# Patient Record
Sex: Male | Born: 1980 | Race: White | Hispanic: No
Health system: Southern US, Community
[De-identification: ages and names within clinical notes are randomized; demographics above are authoritative.]

---

## 2004-11-22 ENCOUNTER — Emergency Department: Payer: Self-pay | Admitting: Emergency Medicine

## 2007-06-16 ENCOUNTER — Emergency Department: Payer: Self-pay | Admitting: Emergency Medicine

## 2011-02-07 ENCOUNTER — Ambulatory Visit: Payer: Self-pay | Admitting: Family Medicine

## 2011-10-12 ENCOUNTER — Emergency Department: Payer: Self-pay | Admitting: Emergency Medicine

## 2011-10-15 ENCOUNTER — Ambulatory Visit: Payer: Self-pay | Admitting: Internal Medicine

## 2012-01-18 ENCOUNTER — Emergency Department: Payer: Self-pay | Admitting: Emergency Medicine

## 2012-05-10 ENCOUNTER — Emergency Department: Payer: Self-pay | Admitting: Emergency Medicine

## 2012-05-10 LAB — DRUG SCREEN, URINE
Amphetamines, Ur Screen: POSITIVE (ref ?–1000)
Cannabinoid 50 Ng, Ur ~~LOC~~: NEGATIVE (ref ?–50)
Cocaine Metabolite,Ur ~~LOC~~: NEGATIVE (ref ?–300)
MDMA (Ecstasy)Ur Screen: NEGATIVE (ref ?–500)
Opiate, Ur Screen: NEGATIVE (ref ?–300)
Phencyclidine (PCP) Ur S: NEGATIVE (ref ?–25)
Tricyclic, Ur Screen: NEGATIVE (ref ?–1000)

## 2012-05-10 LAB — URINALYSIS, COMPLETE
Bilirubin,UR: NEGATIVE
Glucose,UR: NEGATIVE mg/dL (ref 0–75)
Ketone: NEGATIVE
Leukocyte Esterase: NEGATIVE
Nitrite: NEGATIVE
Protein: NEGATIVE
Specific Gravity: 1.029 (ref 1.003–1.030)
Squamous Epithelial: NONE SEEN

## 2012-05-10 LAB — CBC
HCT: 39.3 % — ABNORMAL LOW (ref 40.0–52.0)
HGB: 13.3 g/dL (ref 13.0–18.0)
MCH: 25 pg — ABNORMAL LOW (ref 26.0–34.0)
MCV: 74 fL — ABNORMAL LOW (ref 80–100)
Platelet: 304 10*3/uL (ref 150–440)
RBC: 5.3 10*6/uL (ref 4.40–5.90)
WBC: 10.4 10*3/uL (ref 3.8–10.6)

## 2012-05-10 LAB — COMPREHENSIVE METABOLIC PANEL
Albumin: 4.2 g/dL (ref 3.4–5.0)
Alkaline Phosphatase: 87 U/L (ref 50–136)
Bilirubin,Total: 0.4 mg/dL (ref 0.2–1.0)
Co2: 27 mmol/L (ref 21–32)
Creatinine: 0.9 mg/dL (ref 0.60–1.30)
EGFR (African American): >60
SGOT(AST): 28 U/L (ref 15–37)
SGPT (ALT): 28 U/L (ref 12–78)

## 2012-05-10 LAB — ETHANOL: Ethanol: <3 mg/dL

## 2012-05-10 LAB — TSH: Thyroid Stimulating Horm: 2.69 u[IU]/mL

## 2012-05-10 LAB — ACETAMINOPHEN LEVEL: Acetaminophen: <2 ug/mL

## 2012-06-26 ENCOUNTER — Ambulatory Visit: Payer: Self-pay | Admitting: Internal Medicine

## 2012-07-07 ENCOUNTER — Emergency Department: Payer: Self-pay | Admitting: Emergency Medicine

## 2012-07-07 LAB — COMPREHENSIVE METABOLIC PANEL
Albumin: 3.8 g/dL (ref 3.4–5.0)
Anion Gap: 7 (ref 7–16)
BUN: 11 mg/dL (ref 7–18)
Calcium, Total: 8.7 mg/dL (ref 8.5–10.1)
Chloride: 107 mmol/L (ref 98–107)
Co2: 26 mmol/L (ref 21–32)
EGFR (Non-African Amer.): >60
Glucose: 89 mg/dL (ref 65–99)
Osmolality: 278 (ref 275–301)
Potassium: 3.7 mmol/L (ref 3.5–5.1)
Sodium: 140 mmol/L (ref 136–145)
Total Protein: 6.9 g/dL (ref 6.4–8.2)

## 2012-07-07 LAB — DRUG SCREEN, URINE
Barbiturates, Ur Screen: NEGATIVE (ref ?–200)
Benzodiazepine, Ur Scrn: POSITIVE (ref ?–200)
Cannabinoid 50 Ng, Ur ~~LOC~~: NEGATIVE (ref ?–50)
MDMA (Ecstasy)Ur Screen: NEGATIVE (ref ?–500)
Opiate, Ur Screen: POSITIVE (ref ?–300)
Tricyclic, Ur Screen: NEGATIVE (ref ?–1000)

## 2012-07-07 LAB — TSH: Thyroid Stimulating Horm: 0.92 u[IU]/mL

## 2012-07-07 LAB — CBC
HCT: 36.6 % — ABNORMAL LOW (ref 40.0–52.0)
HGB: 12.5 g/dL — ABNORMAL LOW (ref 13.0–18.0)
MCH: 25.5 pg — ABNORMAL LOW (ref 26.0–34.0)
MCHC: 34.3 g/dL (ref 32.0–36.0)
RBC: 4.92 10*6/uL (ref 4.40–5.90)

## 2012-07-07 LAB — ACETAMINOPHEN LEVEL: Acetaminophen: <2 ug/mL

## 2012-07-07 LAB — ETHANOL
Ethanol %: <0.003 % (ref 0.000–0.080)
Ethanol: <3 mg/dL

## 2012-07-07 LAB — SALICYLATE LEVEL: Salicylates, Serum: 2.9 mg/dL — ABNORMAL HIGH

## 2013-04-09 IMAGING — CR DG KNEE COMPLETE 4+V*R*
1 series · 4 of 4 positions shown · non-contrast
Comparison: none

REASON FOR EXAM: fall, posterior medial knee pain
COMMENTS:

PROCEDURE:     DXR - DXR KNEE RT COMP WITH OBLIQUES  - October 12, 2011  [DATE]
RESULT:     Images of the right knee demonstrate no fracture, dislocation or
foreign body.

[Series 1: t knee ap right · 0.14mm/px · 4 of 4 slices shown]
[im 1/4]
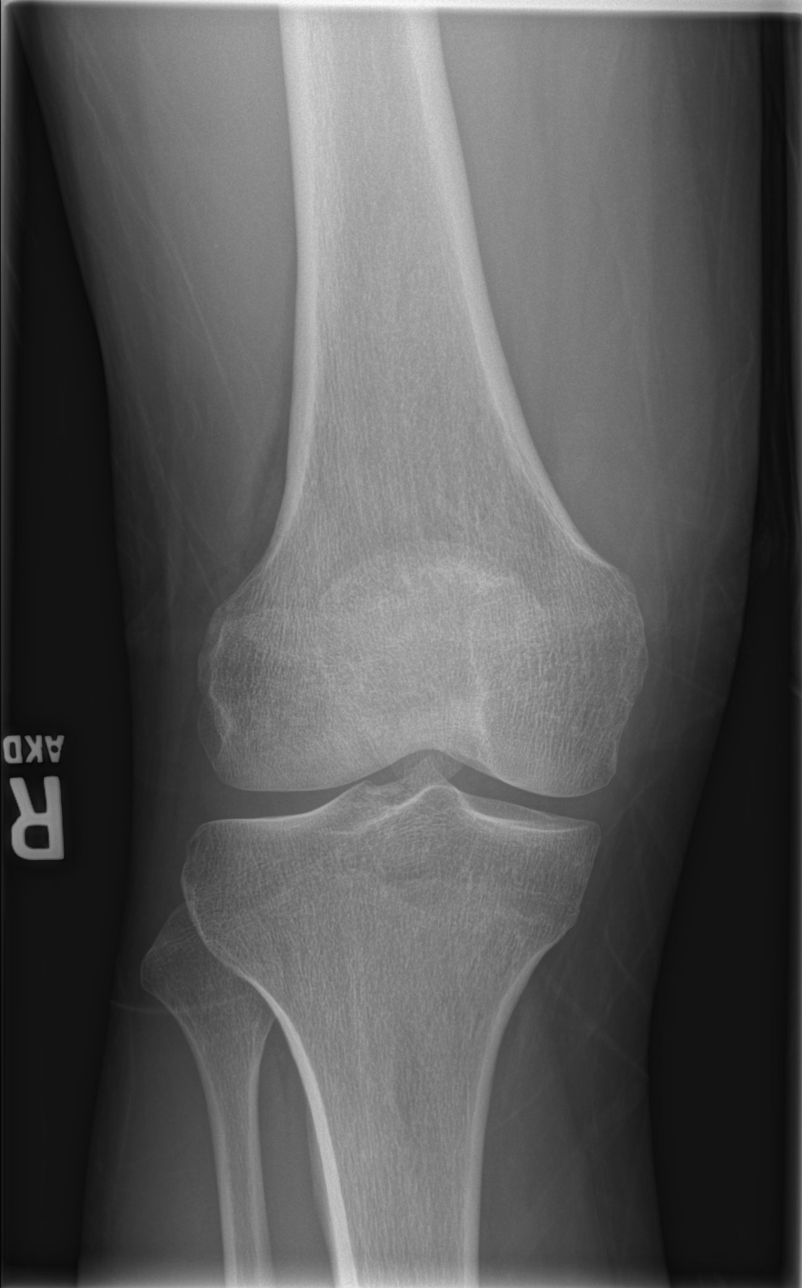
[im 2/4]
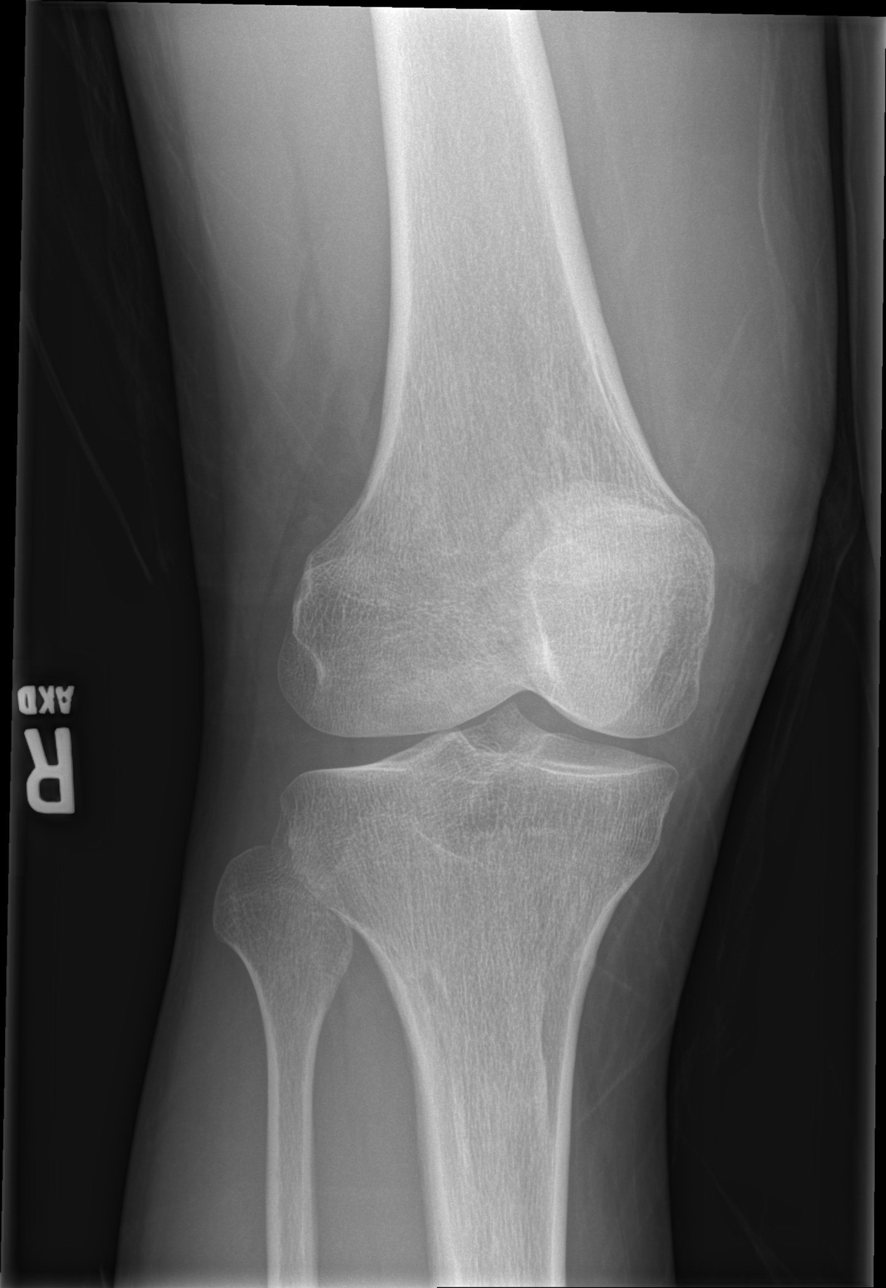
[im 3/4]
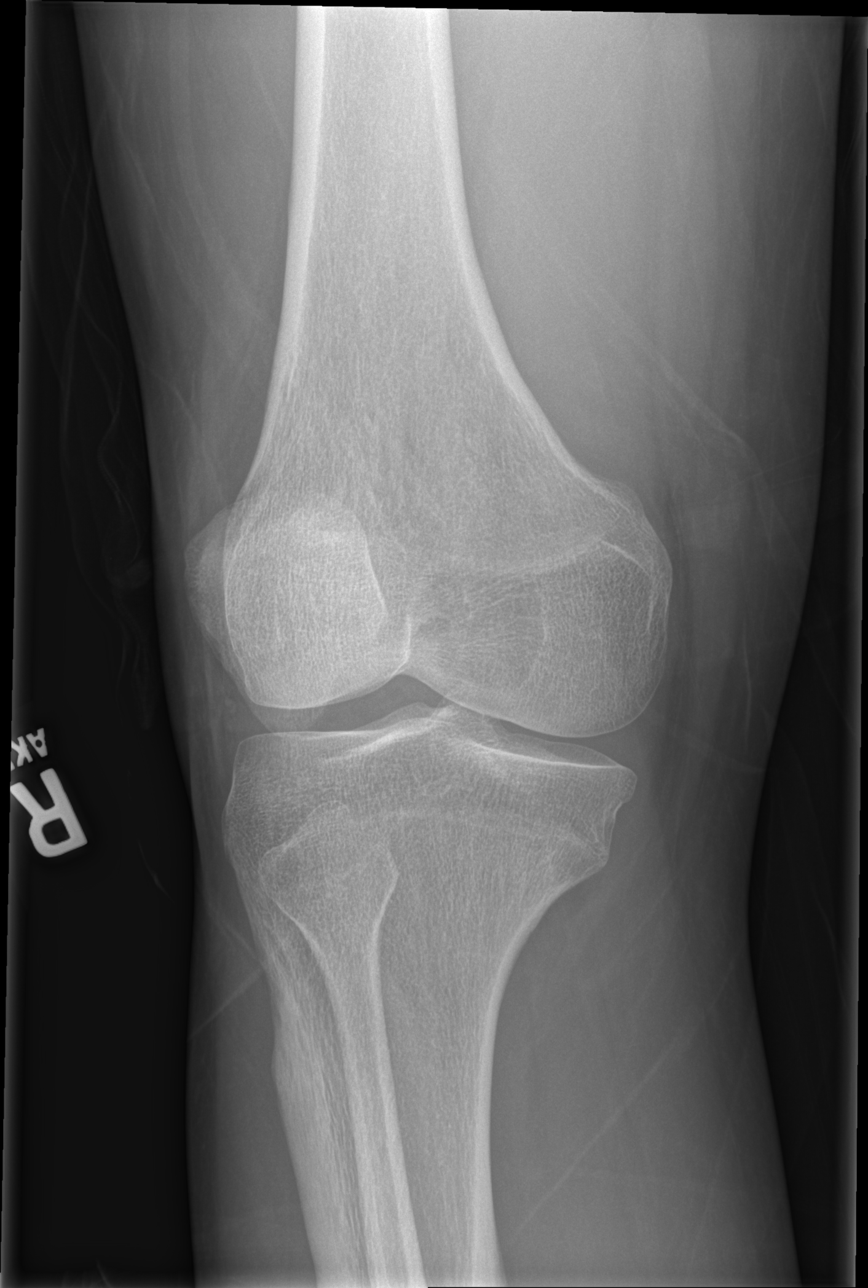
[im 4/4]
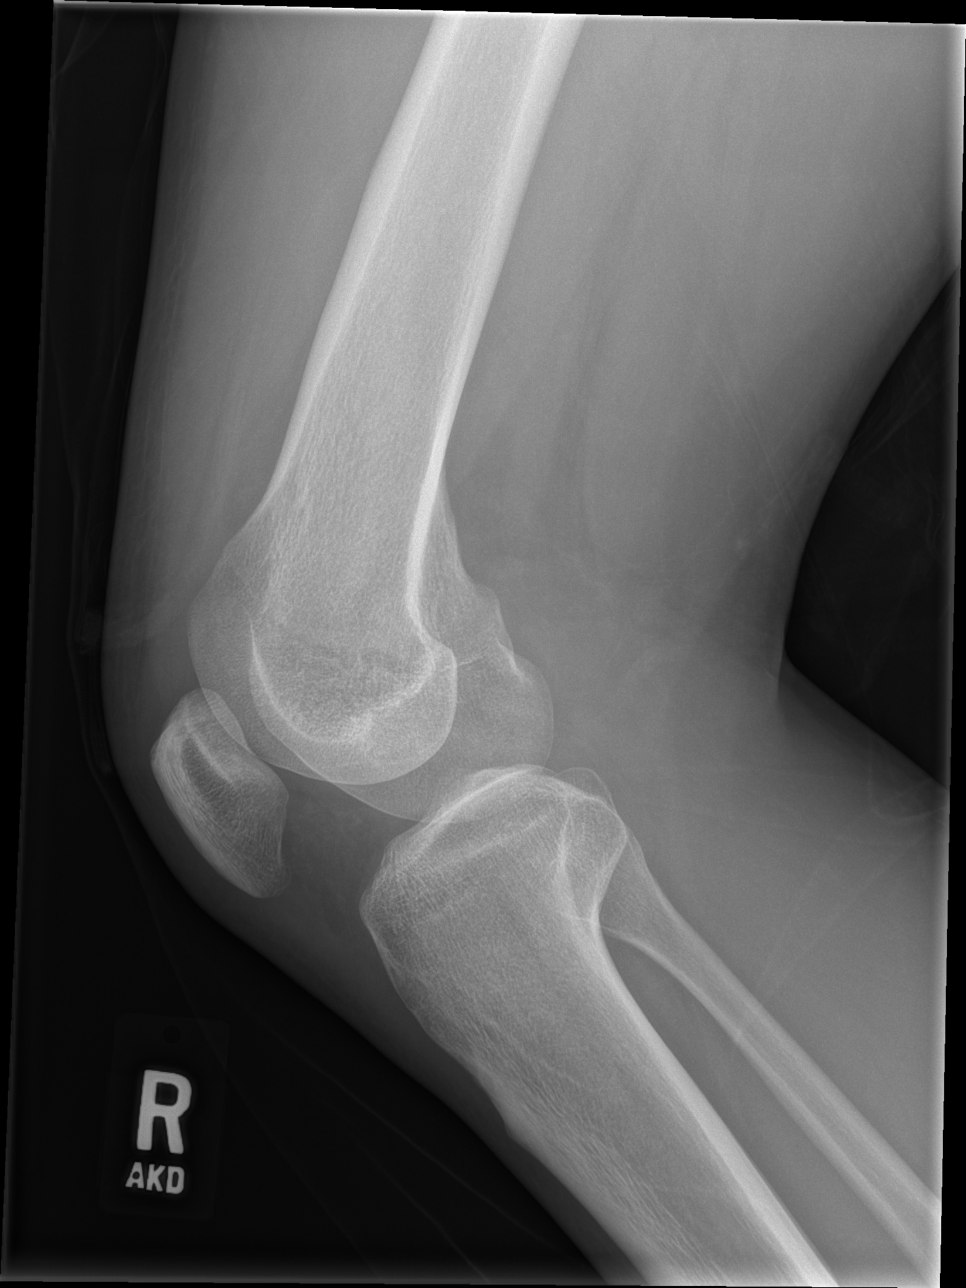

[4 of 4 positions shown; findings below may reference images not displayed]

IMPRESSION: Please see above.

## 2013-09-27 ENCOUNTER — Emergency Department: Payer: Self-pay | Admitting: Emergency Medicine

## 2013-09-27 LAB — COMPREHENSIVE METABOLIC PANEL
ALBUMIN: 3.7 g/dL (ref 3.4–5.0)
ALT: 25 U/L (ref 12–78)
AST: 29 U/L (ref 15–37)
Alkaline Phosphatase: 80 U/L
Anion Gap: 3 — ABNORMAL LOW (ref 7–16)
BUN: 8 mg/dL (ref 7–18)
Bilirubin,Total: 0.3 mg/dL (ref 0.2–1.0)
CALCIUM: 8.9 mg/dL (ref 8.5–10.1)
CHLORIDE: 106 mmol/L (ref 98–107)
Co2: 27 mmol/L (ref 21–32)
Creatinine: 0.82 mg/dL (ref 0.60–1.30)
EGFR (Non-African Amer.): >60
GLUCOSE: 125 mg/dL — AB (ref 65–99)
Osmolality: 272 (ref 275–301)
Potassium: 3.6 mmol/L (ref 3.5–5.1)
Sodium: 136 mmol/L (ref 136–145)
Total Protein: 7 g/dL (ref 6.4–8.2)

## 2013-09-27 LAB — CBC
HCT: 40.4 % (ref 40.0–52.0)
HGB: 13.3 g/dL (ref 13.0–18.0)
MCH: 25.5 pg — AB (ref 26.0–34.0)
MCHC: 33 g/dL (ref 32.0–36.0)
MCV: 77 fL — AB (ref 80–100)
Platelet: 264 10*3/uL (ref 150–440)
RBC: 5.23 10*6/uL (ref 4.40–5.90)
RDW: 14.6 % — ABNORMAL HIGH (ref 11.5–14.5)
WBC: 9.9 10*3/uL (ref 3.8–10.6)

## 2013-09-27 LAB — TROPONIN I: Troponin-I: <0.02 ng/mL

## 2014-12-13 NOTE — Consult Note (Signed)
PATIENT NAME:  Darin Rodriguez, Darin Rodriguez MR#:  [id number] DATE OF BIRTH:  1981/04/29  DATE OF CONSULTATION:  07/07/2012  REFERRING PHYSICIAN:  Lowella Fairy, MD   CONSULTING PHYSICIAN:  Jolanta B. Pucilowska, MD  REASON FOR CONSULTATION: To evaluate a patient after a suicide attempt.   IDENTIFYING DATA: Mr. Ripberger is a 34 year old male with history of substance abuse.   CHIEF COMPLAINT: "I don't care."   HISTORY OF PRESENT ILLNESS Mr. Pannone was brought to the Emergency Room by the police after he lacerated both of his wrists and skin under his chin. The ones on his arms required stitches. We have learned that the patient went to the house of his estranged wife where his children reside and wanted to see his kids. He was not allowed to do so. He started banging on the door and then cut himself. The police were called to the scene. His urine tox screen was positive for amphetamines, benzodiazepines and opioids. The patient has been in the care of a psychiatrist, Dr. Lenore Cordia, in [place] who prescribes  Adderall, Klonopin and Paxil. The patient reports that he ran out of Suboxone a month ago as he no longer could afford to go to [facility]. He reported to the nurse that he overuses Klonopin, taking 10 mg on some days. He denies feeling suicidal but believe that he went "crazy" when unable to see his children. He is reportedly being charged with attempt to kidnap his wife. He denies and laughs at the idea. He denies paranoid delusions or hallucinations. He denies alcohol use.   PAST PSYCHIATRIC HISTORY: He denies any prior hospitalizations or substance abuse treatment. No prior suicide attempts.   FAMILY PSYCHIATRIC HISTORY: None reported.   PAST MEDICAL HISTORY: None. Recent cut on forearms sutured.   ALLERGIES: No known drug allergies.   MEDICATIONS ON ADMISSION: Per his old chart: 1. Flexeril 10 mg t.i.d.  2. Suboxone 8 mg t.i.d.  3. Adderall 20 mg b.i.d.   4. Klonopin 1 mg t.i.d. as needed for  anxiety.  5. Paxil 10 mg daily.   SOCIAL HISTORY: He is still employed, has been working at the same place for the past three years. The job is well-paying, and he was able to support his family. He works second shift. His wife moved out in September and would not allow him to see the kids. He has a conflict with almost everybody in his family as well as his wife.    REVIEW OF SYSTEMS: CONSTITUTIONAL: No fevers or chills. Positive for some weight loss. EYES: No double or blurred vision. ENT: No hearing loss. RESPIRATORY: No shortness of breath or cough. CARDIOVASCULAR: No chest pain or orthopnea. GASTROINTESTINAL: No abdominal pain, nausea, vomiting, or diarrhea. GU: No incontinence or frequency. ENDOCRINE: No heat or cold intolerance. LYMPHATIC: No anemia or easy bruising. INTEGUMENTARY: No acne or rash. MUSCULOSKELETAL: No muscle or joint pain. NEUROLOGIC: No tingling or weakness. PSYCHIATRIC: See history of present illness for details.   PHYSICAL EXAMINATION:  VITAL SIGNS: Blood pressure 121/59, pulse 83, respirations 16, temperature 98.2.   GENERAL: This is a slender male in no acute distress. The rest of the physical examination is deferred to his primary attending.   LABORATORY DATA: Chemistries are within normal limits. Blood alcohol level is zero. LFTs are within normal limits except for AST of 54 and ALT of 90. TSH 0.92. Urine tox screen positive for amphetamines, benzodiazepines and opiates. CBC within normal limits. Serum acetaminophen less than 2.0. Serum salicylates 2.9.  MENTAL STATUS EXAMINATION: The patient is asleep but easily arousable. He is oriented to person, place, time, and situation. He is cooperative. He is very quiet. There is psychomotor retardation. He is tearful on the interview. He wears a yellow shirt. He maintains some eye contact. His speech is very soft. Mood is anxious with tearful affect. Thought processing is logical and goal oriented. There is poverty of thought.  Thought content: He denies suicidal or homicidal ideation but was brought to the hospital after a suicide attempt. There are no delusions or paranoia. There are no auditory or visual hallucinations. His cognition is grossly intact. His insight and judgment are questionable.   SUICIDE RISK ASSESSMENT: This is a patient with a history of depression, anxiety and substance abuse who just attempted a suicide.  DIAGNOSES:  AXIS I:  1. Major depressive disorder, recurrent, severe. 2. Opioid dependence.   AXIS II: Deferred.   AXIS III: Chronic pain.   AXIS IV: Mental illness, substance abuse, marital conflict, family conflict, primary support, access to care.   AXIS V: Global Assessment of Functioning score is 25.   PLAN:  1. The patient is under arrest. He requires psychiatric care at the same time. We believe that our best scenario is to refer this patient to [facility] facility as we believe that substance use led to the unfortunate events today and his arrest.  2. We will continue all his medications as prescribed by Dr. Lenore Cordiaorrigan except for benzodiazepines and Suboxone. The patient has been off of it for a while now.  3. I will follow up.  ____________________________ Braulio ConteJolanta B. Jennet MaduroPucilowska, MD jbp:cbb D: 07/07/2012 16:36:00 ET T: 07/07/2012 17:36:36 ET JOB#: [id number]  cc: Jolanta B. Jennet MaduroPucilowska, MD, <Dictator> Shari ProwsJOLANTA B PUCILOWSKA MD ELECTRONICALLY SIGNED 07/07/2012 19:56
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
## 2014-12-13 NOTE — Consult Note (Signed)
Brief Consult Note: Diagnosis: MDD, ADHD, Opioid dependence.   Patient was seen by consultant.   Consult note dictated.   Recommend further assessment or treatment.   Orders entered.   Discussed with Attending MD.   Comments: [name] has a h/o depression, anxiety, ADHD and opioid dependence on Suboxone. He was petitioned by his parents after he sent a text message suggesting he was suicidal. He denies suicide ideation, intention or a plan and admits that he was trying to get his wife's sympathy. She left him 2 weeks ago when she discovered that he was having an affair. He denies thoughts of hurting himself or others.   PLAN: 1. The patient no longer meets criteria for IVC. I will terminate proceedings. Please discharge as appropriate.   2. He is to continue all medications as prescribed by Dr. Ocie Rodriguez. He run out of paxil. I will provide Rx.  3. He will follow up with Dr. Ocie Rodriguez on Friday.  4. His family will pick him up.  Electronic Signatures: Darin Rodriguez, Darin Rodriguez (MD)  (Signed 16-Sep-13 11:47)  Authored: Brief Consult Note   Last Updated: 16-Sep-13 11:47 by Darin Rodriguez, Darin Rodriguez (MD)

## 2017-07-01 ENCOUNTER — Emergency Department: Payer: Self-pay

## 2017-07-01 ENCOUNTER — Encounter: Payer: Self-pay | Admitting: Emergency Medicine

## 2017-07-01 ENCOUNTER — Emergency Department
Admission: EM | Admit: 2017-07-01 | Discharge: 2017-07-01 | Disposition: A | Discharge status: Home / Self Care | Payer: Self-pay | Attending: Student in an Organized Health Care Education/Training Program | Admitting: Student in an Organized Health Care Education/Training Program

## 2017-07-01 DIAGNOSIS — F17200 Nicotine dependence, unspecified, uncomplicated: Secondary | ICD-10-CM | POA: Insufficient documentation

## 2017-07-01 DIAGNOSIS — R05 Cough: Secondary | ICD-10-CM | POA: Insufficient documentation

## 2017-07-01 DIAGNOSIS — R683 Clubbing of fingers: Secondary | ICD-10-CM | POA: Insufficient documentation

## 2017-07-01 DIAGNOSIS — R0602 Shortness of breath: Secondary | ICD-10-CM | POA: Insufficient documentation

## 2017-07-01 DIAGNOSIS — R059 Cough, unspecified: Secondary | ICD-10-CM

## 2017-07-01 LAB — TROPONIN I

## 2017-07-01 LAB — CBC WITH DIFFERENTIAL/PLATELET
BASOS ABS: 0.1 10*3/uL (ref 0–0.1)
BASOS PCT: 1 %
EOS PCT: 2 %
Eosinophils Absolute: 0.3 10*3/uL (ref 0–0.7)
HEMATOCRIT: 45.1 % (ref 40.0–52.0)
Hemoglobin: 15 g/dL (ref 13.0–18.0)
Lymphocytes Relative: 27 %
Lymphs Abs: 2.8 10*3/uL (ref 1.0–3.6)
MCH: 25.9 pg — ABNORMAL LOW (ref 26.0–34.0)
MCHC: 33.3 g/dL (ref 32.0–36.0)
MCV: 78 fL — ABNORMAL LOW (ref 80.0–100.0)
MONO ABS: 1.1 10*3/uL — AB (ref 0.2–1.0)
Monocytes Relative: 10 %
NEUTROS ABS: 6.2 10*3/uL (ref 1.4–6.5)
Neutrophils Relative %: 60 %
PLATELETS: 327 10*3/uL (ref 150–440)
RBC: 5.78 MIL/uL (ref 4.40–5.90)
RDW: 14.2 % (ref 11.5–14.5)
WBC: 10.5 10*3/uL (ref 3.8–10.6)

## 2017-07-01 LAB — BASIC METABOLIC PANEL
ANION GAP: 11 (ref 5–15)
BUN: 10 mg/dL (ref 6–20)
CALCIUM: 9.7 mg/dL (ref 8.9–10.3)
CO2: 26 mmol/L (ref 22–32)
Chloride: 102 mmol/L (ref 101–111)
Creatinine, Ser: 0.8 mg/dL (ref 0.61–1.24)
Glucose, Bld: 90 mg/dL (ref 65–99)
Potassium: 3.6 mmol/L (ref 3.5–5.1)
Sodium: 139 mmol/L (ref 135–145)

## 2017-07-01 LAB — BRAIN NATRIURETIC PEPTIDE: B NATRIURETIC PEPTIDE 5: 4 pg/mL (ref 0.0–100.0)

## 2017-07-01 MED ORDER — ALBUTEROL SULFATE HFA 108 (90 BASE) MCG/ACT IN AERS: 2.0000 | INHALATION_SPRAY | Freq: Four times a day (QID) | RESPIRATORY_TRACT | 2 refills | Status: AC | PRN

## 2017-07-01 MED ORDER — ALBUTEROL SULFATE (2.5 MG/3ML) 0.083% IN NEBU: 5.0000 mg | INHALATION_SOLUTION | Freq: Once | RESPIRATORY_TRACT | Status: DC

## 2017-07-01 MED ORDER — PREDNISONE 10 MG PO TABS: 10.0000 mg | ORAL_TABLET | Freq: Every day | ORAL | 0 refills | Status: AC

## 2017-07-01 NOTE — ED Provider Notes (Signed)
[facility] Emergency Department Provider Note    First MD Initiated Contact with Patient 07/01/17 1319     (approximate)  I have reviewed the triage vital signs and the nursing notes.   HISTORY  Chief Complaint Shortness of Breath and Cough    HPI Darin Rodriguez is a 36 y.o. male presents with chief complaint of shortness of breath and intermittent cough that is been going on for several months.  Patient does smoke half pack per day and has been doing this for the past 10 years.  Denies any chest pain.  No lower extremity swelling.  Has noted clubbing of his digits that he is noted since he was a kid.  No family history of known lung disease.  Does have a remote history of family history of cancer.  No known h/o heart disease  History reviewed. No pertinent past medical history. No family history on file. History reviewed. No pertinent surgical history. There are no active problems to display for this patient.     Prior to Admission medications   Medication Sig Start Date End Date Taking? Authorizing Provider  albuterol (PROVENTIL HFA;VENTOLIN HFA) 108 (90 Base) MCG/ACT inhaler Inhale 2 puffs every 6 (six) hours as needed into the lungs for wheezing or shortness of breath. 07/01/17   Willy Eddyobinson, Renelda Kilian, MD  predniSONE (DELTASONE) 10 MG tablet Take 1 tablet (10 mg total) daily by mouth. Day 1-2: Take 50 mg  ( 5 pills) Day 3-4 : Take 40 mg (4pills) Day 5-6: Take 30 mg (3 pills) Day 7-8:  Take 20 mg (2 pills) Day 9:  Take 10mg  (1 pill) 07/01/17   Willy Eddyobinson, Keagen Heinlen, MD    Allergies Patient has no known allergies.    Social History Social History   Tobacco Use  . Smoking status: Not on file  Substance Use Topics  . Alcohol use: Not on file  . Drug use: Not on file    Review of Systems Patient denies headaches, rhinorrhea, blurry vision, numbness, shortness of breath, chest pain, edema, cough, abdominal pain, nausea, vomiting, diarrhea, dysuria,  fevers, rashes or hallucinations unless otherwise stated above in HPI. ____________________________________________   PHYSICAL EXAM:  VITAL SIGNS: Vitals:   07/01/17 0905  BP: 123/65  Pulse: 98  Resp: 16  Temp: 98.4 F (36.9 C)  SpO2: 98%    Constitutional: Alert and oriented. Well appearing and in no acute distress. Eyes: Conjunctivae are normal.  Head: Atraumatic. Nose: No congestion/rhinnorhea. Mouth/Throat: Mucous membranes are moist.   Neck: No stridor. Painless ROM.  Cardiovascular: Normal rate, regular rhythm. Grossly normal heart sounds.  Good peripheral circulation. Respiratory: Normal respiratory effort.  No retractions. Lungs with scattered wheeze Gastrointestinal: Soft and nontender. No distention. No abdominal bruits. No CVA tenderness. Genitourinary: Musculoskeletal: No lower extremity tenderness nor edema.  No joint effusions.  + clubbed fingers and toes, no cyanosis Neurologic:  Normal speech and language. No gross focal neurologic deficits are appreciated. No facial droop Skin:  Skin is warm, dry and intact. No rash noted. Psychiatric: Mood and affect are normal. Speech and behavior are normal.  ____________________________________________   LABS (all labs ordered are listed, but only abnormal results are displayed)  Results for orders placed or performed during the hospital encounter of 07/01/17 (from the past 24 hour(s))  CBC with Differential     Status: Abnormal   Collection Time: 07/01/17  9:57 AM  Result Value Ref Range   WBC 10.5 3.8 - 10.6 K/uL  RBC 5.78 4.40 - 5.90 MIL/uL   Hemoglobin 15.0 13.0 - 18.0 g/dL   HCT 16.145.1 09.640.0 - 04.552.0 %   MCV 78.0 (L) 80.0 - 100.0 fL   MCH 25.9 (L) 26.0 - 34.0 pg   MCHC 33.3 32.0 - 36.0 g/dL   RDW 40.914.2 81.111.5 - 91.414.5 %   Platelets 327 150 - 440 K/uL   Neutrophils Relative % 60 %   Neutro Abs 6.2 1.4 - 6.5 K/uL   Lymphocytes Relative 27 %   Lymphs Abs 2.8 1.0 - 3.6 K/uL   Monocytes Relative 10 %   Monocytes  Absolute 1.1 (H) 0.2 - 1.0 K/uL   Eosinophils Relative 2 %   Eosinophils Absolute 0.3 0 - 0.7 K/uL   Basophils Relative 1 %   Basophils Absolute 0.1 0 - 0.1 K/uL  Basic metabolic panel     Status: None   Collection Time: 07/01/17  9:57 AM  Result Value Ref Range   Sodium 139 135 - 145 mmol/L   Potassium 3.6 3.5 - 5.1 mmol/L   Chloride 102 101 - 111 mmol/L   CO2 26 22 - 32 mmol/L   Glucose, Bld 90 65 - 99 mg/dL   BUN 10 6 - 20 mg/dL   Creatinine, Ser 7.820.80 0.61 - 1.24 mg/dL   Calcium 9.7 8.9 - 95.610.3 mg/dL   GFR calc non Af Amer >60 >60 mL/min   GFR calc Af Amer >60 >60 mL/min   Anion gap 11 5 - 15  Brain natriuretic peptide     Status: None   Collection Time: 07/01/17  9:57 AM  Result Value Ref Range   B Natriuretic Peptide 4.0 0.0 - 100.0 pg/mL  Troponin I     Status: None   Collection Time: 07/01/17  9:57 AM  Result Value Ref Range   Troponin I <0.03 <0.03 ng/mL   ____________________________________________  EKG My review and personal interpretation at Time: 9:30   Indication: sob  Rate: 85  Rhythm: sinus Axis: normal Other: no stemi, poor rwave progression ____________________________________________  RADIOLOGY  I personally reviewed all radiographic images ordered to evaluate for the above acute complaints and reviewed radiology reports and findings.  These findings were personally discussed with the patient.  Please see medical record for radiology report.  ____________________________________________   PROCEDURES  Procedure(s) performed:  Procedures    Critical Care performed: no ____________________________________________   INITIAL IMPRESSION / ASSESSMENT AND PLAN / ED COURSE  Pertinent labs & imaging results that were available during my care of the patient were reviewed by me and considered in my medical decision making (see chart for details).  DDX: Asthma, copd, CHF, pna, ptx, malignancy, Pe, anemia   Darin Rodriguez is a 36 y.o. who presents to  the ED with presents with several months of cough and shortness of breath.  Patient in no respiratory distress and symptoms have been ongoing for quite some time now.  Has not sought medical attention previously.  Patient is a smoker and his exam is concerning for some component of obstructive lung disease.  EKG shows no evidence of acute ischemia troponin is negative.  Patient's without chest pain.  No evidence of congestive heart failure.  Patient is low risk by Wells criteria and is PERC negative.  No evidence of pneumonia.  Based on his smoking history discussed with his smoking cessation as well as initiation of albuterol inhaler therapy with a prednisone burst with follow-up with PCP and referral to pulmonology.  Discussed  signs and symptoms for which he should return immediately to the hospital.      ____________________________________________   FINAL CLINICAL IMPRESSION(S) / ED DIAGNOSES  Final diagnoses:  Shortness of breath  Cough  Clubbing of fingers      NEW MEDICATIONS STARTED DURING THIS VISIT:  This SmartLink is deprecated. Use AVSMEDLIST instead to display the medication list for a patient.   Note:  This document was prepared using Dragon voice recognition software and may include unintentional dictation errors.    Willy Eddy, MD 07/01/17 [phone]

## 2017-07-01 NOTE — ED Triage Notes (Signed)
Pt reports for the past several months he has had some episodes where he could not breath. Pt reports he feels as though he can not get a good breath in. Pt reports that he will often time wake up at night choking because he can not breathe. Pt reports that he has clubbed fingers and toes and was told at an early age that it may cause issues with his lungs as he gets older. Pt reports symptoms have been extremely bad over the past 2 week.

## 2017-07-01 NOTE — ED Notes (Signed)
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

## 2022-04-22 ENCOUNTER — Other Ambulatory Visit: Payer: Self-pay

## 2022-04-22 ENCOUNTER — Emergency Department
Admission: EM | Admit: 2022-04-22 | Discharge: 2022-04-22 | Disposition: A | Discharge status: Home / Self Care | Payer: Self-pay | Attending: Physician Assistant | Admitting: Physician Assistant

## 2022-04-22 ENCOUNTER — Encounter: Payer: Self-pay | Admitting: Emergency Medicine

## 2022-04-22 DIAGNOSIS — K029 Dental caries, unspecified: Secondary | ICD-10-CM

## 2022-04-22 DIAGNOSIS — K047 Periapical abscess without sinus: Secondary | ICD-10-CM

## 2022-04-22 MED ORDER — AMOXICILLIN-POT CLAVULANATE 875-125 MG PO TABS
1.0000 | ORAL_TABLET | Freq: Once | ORAL | Status: AC
  Administered 2022-04-22: 1 via ORAL
  Filled 2022-04-22: qty 1

## 2022-04-22 MED ORDER — AMOXICILLIN-POT CLAVULANATE 875-125 MG PO TABS: 1.0000 | ORAL_TABLET | Freq: Two times a day (BID) | ORAL | 0 refills | Status: AC

## 2022-04-22 NOTE — Discharge Instructions (Addendum)
Take antibiotic as directed until all pills are gone.  Follow-up with one of the dental clinics listed below.  OPTIONS FOR DENTAL FOLLOW UP CARE  [place] [facility] - Local Safety Net Dental Clinics [email or web address]   [facility] [phone])  Darin Rodriguez [phone])  [place] [phone])  [facility] [phone])  [facility] [phone]) This clinic caters to the indigent population and is on a lottery system. Location: [employer] [facility], [employer], [address] [place] Clinic Hours: Wednesdays from 6pm - 9pm, patients seen by a lottery system. For dates, call or go to [email or web address] Services: Cleanings, fillings and simple extractions. Payment Options: DENTAL WORK IS FREE OF CHARGE. Bring proof of income or support. Best way to get seen: Arrive at 5:15 pm - this is a lottery, NOT first come/first serve, so arriving earlier will not increase your chances of being seen.     [facility] [phone] Select option 1 for emergencies   Location: [facility], [place], [address], [place] Clinic Hours: No walk-ins accepted - call the day before to schedule an appointment. Check in times are 9:30 am and 1:30 pm. Services: Simple extractions, temporary fillings, pulpectomy/pulp debridement, uncomplicated abscess drainage. Payment Options: PAYMENT IS DUE AT THE TIME OF SERVICE.  Fee is usually $100-200, additional surgical procedures (e.g. abscess drainage) may be extra. Cash, checks, Visa/MasterCard accepted.  Can file Medicaid if patient is covered for dental - patient should call case worker to check. No discount for [facility] patients. Best way to get seen: MUST call the day before and get onto the schedule. Can  usually be seen the next 1-2 days. No walk-ins accepted.     [facility] [phone]   Location: [facility], [address] [place] Clinic Hours: M, W, Th, F 8am or 1:30pm, Tues 9a or 1:30 - first come/first served. Services: Simple extractions, temporary fillings, uncomplicated abscess drainage.  You do not need to be an [facility] resident. Payment Options: PAYMENT IS DUE AT THE TIME OF SERVICE. Dental insurance, otherwise sliding scale - bring proof of income or support. Depending on income and treatment needed, cost is usually $50-200. Best way to get seen: Arrive early as it is first come/first served.     [facility] [phone]   Location: [address] Clinic Hours: Mon-Thu 8a-5p Services: Most basic dental services including extractions and fillings. Payment Options: PAYMENT IS DUE AT THE TIME OF SERVICE. Sliding scale, up to 50% off - bring proof if income or support. Medicaid with dental option accepted. Best way to get seen: Call to schedule an appointment, can usually be seen within 2 weeks OR they will try to see walk-ins - show up at 8a or 2p (you may have to wait).     [facility] [phone] [place] RESIDENTS ONLY   Location: [facility], [address] [place], [place] [address] Clinic Hours: By appointment only. Monday - Thursday 8am-5pm, Friday 8am-12pm Services: Cleanings, fillings, extractions. Payment Options: PAYMENT IS DUE AT THE TIME OF SERVICE. Cash, Visa or MasterCard. Sliding scale - $30 minimum per service. Best way to get seen: Come in to office, complete packet and make an appointment - need proof of income or support monies for each household member and proof of [facility] residence. Usually takes about a  month to get in.     [facility] [phone]   Location: [address], [facility] Hours: Walk-in Urgent Care Dental Services are offered Monday-Friday mornings only. The numbers of emergencies accepted daily is limited to the number of providers available. Maximum 15 - Mondays, Wednesdays & Thursdays Maximum 10 - Tuesdays & Fridays Services: You do not need to be a [facility] resident to be seen for a dental emergency. Emergencies are defined as pain, swelling, abnormal bleeding, or dental trauma. Walkins will receive x-rays if needed. NOTE: Dental cleaning is not an emergency. Payment Options: PAYMENT IS DUE AT THE TIME OF SERVICE. Minimum co-pay is $40.00 for uninsured patients. Minimum co-pay is $3.00 for Medicaid with dental coverage. Dental Insurance is accepted and must be presented at time of visit. Medicare does not cover dental. Forms of payment: Cash, credit card, checks. Best way to get seen: If not previously registered with the clinic, walk-in dental registration begins at 7:15 am and is on a first come/first serve basis. If previously registered with the clinic, call to make an appointment.     [facility] [phone] [place] RESIDENTS ONLY   Location: [address] [place], [place] Clinic Hours: Mon-Thu 10a-2p Services: Extractions only! Payment Options: FREE (donations accepted) - bring proof of income or support Best way to get seen: Call and schedule an appointment OR come at 8am on the 1st Monday of every month (except for holidays) when it is first come/first served.     [facility] [phone]   Location: [address] [place], [place] Clinic Hours: Friday mornings Services, Payment Options, Best way to get seen: Call for info

## 2022-04-22 NOTE — ED Notes (Signed)
Dc instructions and scripts reviewed with pt no questions or concerns at this time will follow up with dentist

## 2022-04-22 NOTE — ED Triage Notes (Signed)
Right facial pain since Friday. States has known broken tooth to that side.  Small amount of facial swelling.

## 2022-04-22 NOTE — ED Provider Notes (Signed)
[facility] Emergency Department Provider Note     None    (approximate)   History   Dental Pain   HPI  Darin Rodriguez is a 41 y.o. male with a noncontributory medical history, presents to the ED with acute dental pain.  Patient reports pain and swelling to the second molar of the right upper jaw.  Denies any fevers, chills, or sweats.  Also denies any difficulty breathing, swallowing, or controlling oral secretions.     Physical Exam   Triage Vital Signs: ED Triage Vitals  Enc Vitals Group     BP 04/22/22 1615 125/67     Pulse Rate 04/22/22 1615 71     Resp 04/22/22 1615 18     Temp 04/22/22 1615 98.4 F (36.9 C)     Temp Source 04/22/22 1615 Oral     SpO2 04/22/22 1615 98 %     Weight 04/22/22 1540 160 lb 0.9 oz (72.6 kg)     Height 04/22/22 1540 5\' 8"  (1.727 m)     Head Circumference --      Peak Flow --      Pain Score 04/22/22 1539 8     Pain Loc --      Pain Edu? --      Excl. in GC? --     Most recent vital signs: Vitals:   04/22/22 1615  BP: 125/67  Pulse: 71  Resp: 18  Temp: 98.4 F (36.9 C)  SpO2: 98%    General Awake, no distress. NAD HEENT NCAT. PERRL. EOMI. No rhinorrhea. Mucous membranes are moist.  Uvula is midline and tonsils are flat.  Second upper molar on the right, with some focal gum swelling.  No brawny sublingual edema is appreciated. CV:  Good peripheral perfusion.  RESP:  Normal effort.  ABD:  No distention.    ED Results / Procedures / Treatments   Labs (all labs ordered are listed, but only abnormal results are displayed) Labs Reviewed - No data to display   EKG   RADIOLOGY  No results found.   PROCEDURES:  Critical Care performed: No  Procedures   MEDICATIONS ORDERED IN ED: Medications  amoxicillin-clavulanate (AUGMENTIN) 875-125 MG per tablet 1 tablet (has no administration in time range)     IMPRESSION / MDM / ASSESSMENT AND PLAN / ED COURSE  I reviewed the triage vital  signs and the nursing notes.                              Differential diagnosis includes, but is not limited to, dental infection, dental abscess, dental fracture, Ludwig's angina, facial cellulitis  Patient's presentation is most consistent with acute, uncomplicated illness.  Patient's diagnosis is consistent with dental infection secondary to dental caries. Patient will be discharged home with prescriptions for Augmentin. Patient is to follow up with local dental provider as needed or otherwise directed. Patient is given ED precautions to return to the ED for any worsening or new symptoms.     FINAL CLINICAL IMPRESSION(S) / ED DIAGNOSES   Final diagnoses:  Dental infection  Pain due to dental caries     Rx / DC Orders   ED Discharge Orders          Ordered    amoxicillin-clavulanate (AUGMENTIN) 875-125 MG tablet  2 times daily        04/22/22 1620  Note:  This document was prepared using Dragon voice recognition software and may include unintentional dictation errors.    Lissa Hoard, PA-C 04/22/22 1622    Georga Hacking, MD 04/23/22 [phone]
